# Patient Record
Sex: Female | Born: 1985 | Race: Black or African American | Hispanic: No | Marital: Single | Smoking: Never smoker
Health system: Southern US, Community
[De-identification: ages and names within clinical notes are randomized; demographics above are authoritative.]

## PROBLEM LIST (undated history)

## (undated) DIAGNOSIS — J45909 Unspecified asthma, uncomplicated: Secondary | ICD-10-CM

## (undated) DIAGNOSIS — O139 Gestational [pregnancy-induced] hypertension without significant proteinuria, unspecified trimester: Secondary | ICD-10-CM

## (undated) DIAGNOSIS — A6000 Herpesviral infection of urogenital system, unspecified: Secondary | ICD-10-CM

## (undated) HISTORY — PX: APPENDECTOMY: SHX54

---

## 2012-10-02 ENCOUNTER — Emergency Department (HOSPITAL_BASED_OUTPATIENT_CLINIC_OR_DEPARTMENT_OTHER): Payer: Medicaid Other

## 2012-10-02 ENCOUNTER — Emergency Department (HOSPITAL_BASED_OUTPATIENT_CLINIC_OR_DEPARTMENT_OTHER)
Admission: EM | Admit: 2012-10-02 | Discharge: 2012-10-02 | Disposition: A | Discharge status: Home / Self Care | Payer: Medicaid Other | Attending: Emergency Medicine | Admitting: Emergency Medicine

## 2012-10-02 ENCOUNTER — Encounter (HOSPITAL_BASED_OUTPATIENT_CLINIC_OR_DEPARTMENT_OTHER): Payer: Self-pay | Admitting: *Deleted

## 2012-10-02 DIAGNOSIS — O034 Incomplete spontaneous abortion without complication: Secondary | ICD-10-CM | POA: Insufficient documentation

## 2012-10-02 DIAGNOSIS — Z3201 Encounter for pregnancy test, result positive: Secondary | ICD-10-CM | POA: Insufficient documentation

## 2012-10-02 DIAGNOSIS — IMO0002 Reserved for concepts with insufficient information to code with codable children: Secondary | ICD-10-CM | POA: Insufficient documentation

## 2012-10-02 DIAGNOSIS — O219 Vomiting of pregnancy, unspecified: Secondary | ICD-10-CM | POA: Insufficient documentation

## 2012-10-02 DIAGNOSIS — J45909 Unspecified asthma, uncomplicated: Secondary | ICD-10-CM | POA: Insufficient documentation

## 2012-10-02 DIAGNOSIS — O039 Complete or unspecified spontaneous abortion without complication: Secondary | ICD-10-CM

## 2012-10-02 DIAGNOSIS — Z8619 Personal history of other infectious and parasitic diseases: Secondary | ICD-10-CM | POA: Insufficient documentation

## 2012-10-02 HISTORY — DX: Herpesviral infection of urogenital system, unspecified: A60.00

## 2012-10-02 HISTORY — DX: Unspecified asthma, uncomplicated: J45.909

## 2012-10-02 HISTORY — DX: Gestational (pregnancy-induced) hypertension without significant proteinuria, unspecified trimester: O13.9

## 2012-10-02 LAB — ABO/RH: ABO/RH(D): A POS

## 2012-10-02 LAB — URINE MICROSCOPIC-ADD ON

## 2012-10-02 LAB — URINALYSIS, ROUTINE W REFLEX MICROSCOPIC
Glucose, UA: NEGATIVE mg/dL
Ketones, ur: NEGATIVE mg/dL
Protein, ur: NEGATIVE mg/dL
pH: 6 (ref 5.0–8.0)

## 2012-10-02 LAB — PREGNANCY, URINE: Preg Test, Ur: POSITIVE — AB

## 2012-10-02 NOTE — ED Provider Notes (Addendum)
History     CSN: [id number]  Arrival date & time 10/02/12  8413   First MD Initiated Contact with Patient 10/02/12 0902      Chief Complaint  Patient presents with  . Vaginal Bleeding    (Consider location/radiation/quality/duration/timing/severity/associated sxs/prior treatment) HPI Comments: Patient presents with vaginal bleeding that started this morning. She states that she was diagnosed as being pregnant [redacted] weeks ago at [facility]. She states that she was lifting some heavy boxes this morning and noticed some spotting. She now has blood only when she wipes. She denies any abdominal pain or cramping. She's had some nausea with the pregnancy but denies any vomiting. Denies he fevers or chills. Denies any urinary symptoms. She states that the bleeding has improved since it first started.   Past Medical History  Diagnosis Date  . Asthma   . Gestational HTN   . Herpes genitalia     Past Surgical History  Procedure Date  . Appendectomy   . Cesarean section     No family history on file.  History  Substance Use Topics  . Smoking status: Never Smoker   . Smokeless tobacco: Not on file  . Alcohol Use: No    OB History    Grav Para Term Preterm Abortions TAB SAB Ect Mult Living   1               Review of Systems  Constitutional: Negative for fever, chills, diaphoresis and fatigue.  HENT: Negative for congestion, rhinorrhea and sneezing.   Eyes: Negative.   Respiratory: Negative for cough, chest tightness and shortness of breath.   Cardiovascular: Negative for chest pain and leg swelling.  Gastrointestinal: Positive for nausea. Negative for vomiting, abdominal pain, diarrhea and blood in stool.  Genitourinary: Positive for vaginal bleeding. Negative for frequency, hematuria, flank pain and difficulty urinating.  Musculoskeletal: Negative for back pain and arthralgias.  Skin: Negative for rash.  Neurological: Negative for dizziness, speech difficulty,  weakness, numbness and headaches.    Allergies  Review of patient's allergies indicates no known allergies.  Home Medications   Current Outpatient Rx  Name  Route  Sig  Dispense  Refill  . ONDANSETRON 4 MG PO TBDP   Oral   Take 4 mg by mouth every 8 (eight) hours as needed.         Marland Kitchen VALACYCLOVIR HCL 500 MG PO TABS   Oral   Take 500 mg by mouth daily.           BP 120/72  Pulse 91  Temp 98.9 F (37.2 C) (Oral)  Resp 18  Ht 5\' 2"  (1.575 m)  Wt 118 lb (53.524 kg)  BMI 21.58 kg/m2  SpO2 100%  LMP 08/06/2012  Physical Exam  Constitutional: She is oriented to person, place, and time. She appears well-developed and well-nourished.  HENT:  Head: Normocephalic and atraumatic.  Eyes: Pupils are equal, round, and reactive to light.  Neck: Normal range of motion. Neck supple.  Cardiovascular: Normal rate, regular rhythm and normal heart sounds.   Pulmonary/Chest: Effort normal and breath sounds normal. No respiratory distress. She has no wheezes. She has no rales. She exhibits no tenderness.  Abdominal: Soft. Bowel sounds are normal. There is no tenderness. There is no rebound and no guarding.  Genitourinary:       There is a scant amount of dark red blood in the vault. There is no active bleeding. The cervix is closed. There some mild uterine tenderness  but no adnexal tenderness.  Musculoskeletal: Normal range of motion. She exhibits no edema.  Lymphadenopathy:    She has no cervical adenopathy.  Neurological: She is alert and oriented to person, place, and time.  Skin: Skin is warm and dry. No rash noted.  Psychiatric: She has a normal mood and affect.    ED Course  Procedures (including critical care time)  Results for orders placed during the hospital encounter of 10/02/12  URINALYSIS, ROUTINE W REFLEX MICROSCOPIC      Component Value Range   Color, Urine YELLOW  YELLOW   APPearance CLOUDY (*) CLEAR   Specific Gravity, Urine 1.013  1.005 - 1.030   pH 6.0  5.0 -  8.0   Glucose, UA NEGATIVE  NEGATIVE mg/dL   Hgb urine dipstick MODERATE (*) NEGATIVE   Bilirubin Urine NEGATIVE  NEGATIVE   Ketones, ur NEGATIVE  NEGATIVE mg/dL   Protein, ur NEGATIVE  NEGATIVE mg/dL   Urobilinogen, UA 1.0  0.0 - 1.0 mg/dL   Nitrite POSITIVE (*) NEGATIVE   Leukocytes, UA TRACE (*) NEGATIVE  PREGNANCY, URINE      Component Value Range   Preg Test, Ur POSITIVE (*) NEGATIVE  HCG, QUANTITATIVE, PREGNANCY      Component Value Range   hCG, Beta Chain, Quant, S 6279 (*) <5 mIU/mL  ABO/RH      Component Value Range   ABO/RH(D) A POS     No rh immune globuloin NOT A RH IMMUNE GLOBULIN CANDIDATE, PT RH POSITIVE    URINE MICROSCOPIC-ADD ON      Component Value Range   Squamous Epithelial / LPF MANY (*) RARE   WBC, UA 3-6  <3 WBC/hpf   RBC / HPF 0-2  <3 RBC/hpf   Bacteria, UA MANY (*) RARE   US Ob Comp Less 14 Wks  10/02/2012  *RADIOLOGY REPORT*  Clinical Data: Vaginal bleeding.  8-week-1-day gestational age by LMP.  OBSTETRIC <14 WK [place] AND TRANSVAGINAL OB US  Technique:  Both transabdominal and transvaginal ultrasound examinations were performed for complete evaluation of the gestation as well as the maternal uterus, adnexal regions, and pelvic cul-de-sac.  Transvaginal technique was performed to assess early pregnancy.  Comparison:  None.  Intrauterine gestational sac:  Visualized/normal in shape. Yolk sac: Visualized Embryo: Visualized Cardiac Activity: Absent  CRL: 19  mm  8 w  3 d         [place] EDC: 05/11/2013  Maternal uterus/adnexae: Small subchorionic hemorrhage noted.  Both ovaries are normal in appearance.  No evidence of adnexal mass or free fluid.  IMPRESSION: Findings meet definitive criteria for failed pregnancy.  This recommendation follows SRU consensus guidelines: Diagnostic Criteria for Nonviable Pregnancy Early in the First Trimester.  Malva Limes Med 2013; 161:0960-45.   Original Report Authenticated By: Myles Rosenthal, M.D.    US Ob Transvaginal  10/02/2012   *RADIOLOGY REPORT*  Clinical Data: Vaginal bleeding.  8-week-1-day gestational age by LMP.  OBSTETRIC <14 WK [place] AND TRANSVAGINAL OB US  Technique:  Both transabdominal and transvaginal ultrasound examinations were performed for complete evaluation of the gestation as well as the maternal uterus, adnexal regions, and pelvic cul-de-sac.  Transvaginal technique was performed to assess early pregnancy.  Comparison:  None.  Intrauterine gestational sac:  Visualized/normal in shape. Yolk sac: Visualized Embryo: Visualized Cardiac Activity: Absent  CRL: 19  mm  8 w  3 d         [place] EDC: 05/11/2013  Maternal uterus/adnexae: Small subchorionic hemorrhage  noted.  Both ovaries are normal in appearance.  No evidence of adnexal mass or free fluid.  IMPRESSION: Findings meet definitive criteria for failed pregnancy.  This recommendation follows SRU consensus guidelines: Diagnostic Criteria for Nonviable Pregnancy Early in the First Trimester.  Malva Limes Med 2013; 161:0960-45.   Original Report Authenticated By: Myles Rosenthal, M.D.        1. Spontaneous abortion       MDM  Patient was instructed to followup with her OB/GYN his [place] within the next few days. She was advised to call her OB or return here if she has heavy bleeding or worsening pain in her abdomen. Also by surgery return if she feels dizzy or lightheaded or feels like she's going to passout.  Her urine was sent for a culture and she is currently not having any UTI symptoms so I did not start treatment.        Rolan Bucco, MD 10/02/12 1128  Rolan Bucco, MD 10/02/12 4098  Rolan Bucco, MD 10/02/12 1155

## 2012-10-02 NOTE — ED Notes (Signed)
Patient states she developed vaginal bleeding this morning after lifting a heavy box.  States she had her last depo injection in Aug 2013, and had a heavy menstrual cycle on 08/06/12.  Was having severe nausea, was seen at [facility] and had a positive urine pregnancy test.  Denies any pain. States she saw blood with urination, which began as light and then became heavy.

## 2012-10-02 NOTE — ED Notes (Signed)
Pelvic cart is set up at the bedside and ready for the doctor to use. 

## 2012-10-03 LAB — URINE CULTURE: Colony Count: 35000

## 2013-05-14 IMAGING — US US OB TRANSVAGINAL
1 series · 14 of 28 positions shown · non-contrast
Comparison: None.

CLINICAL DATA: Vaginal bleeding.  8-week-4-day gestational age by
LMP.

OBSTETRIC <14 WK US AND TRANSVAGINAL OB US
TECHNIQUE: Both transabdominal and transvaginal ultrasound
examinations were performed for complete evaluation of the
gestation as well as the maternal uterus, adnexal regions, and
pelvic cul-de-sac.  Transvaginal technique was performed to assess
early pregnancy.

[Series 1: us ob transvaginal · 0.24mm/px · 39 acquisitions, 14 frames shown]
[im 2/39]
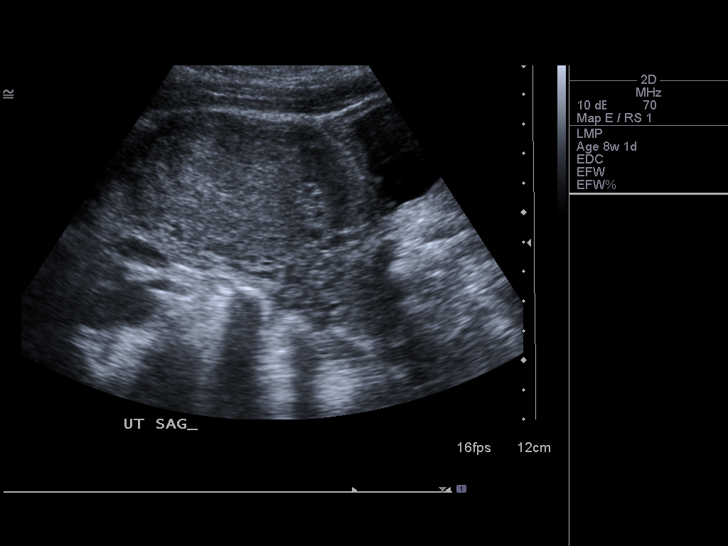
[im 5/39]
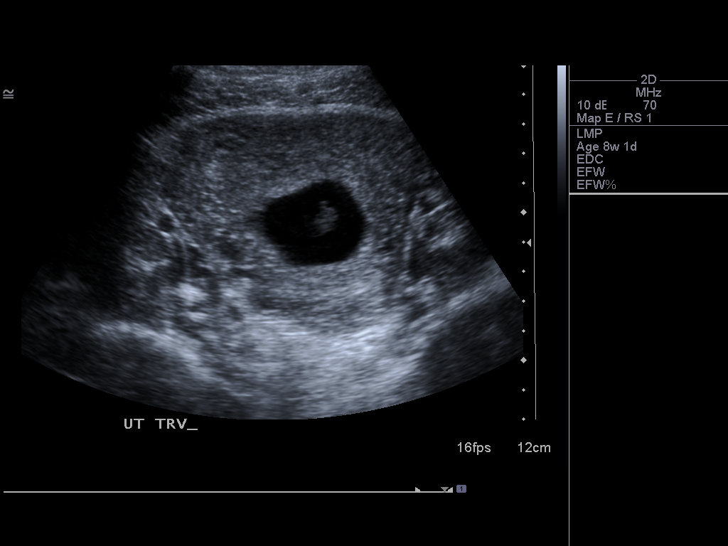
[im 8/39]
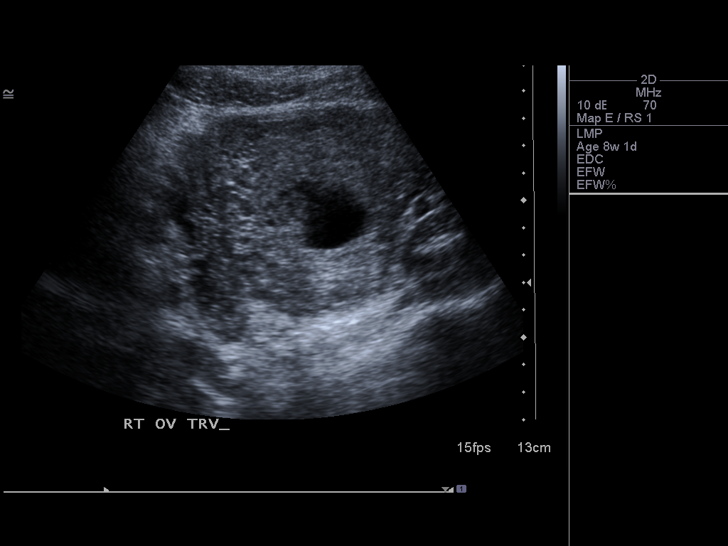
[im 10/39]
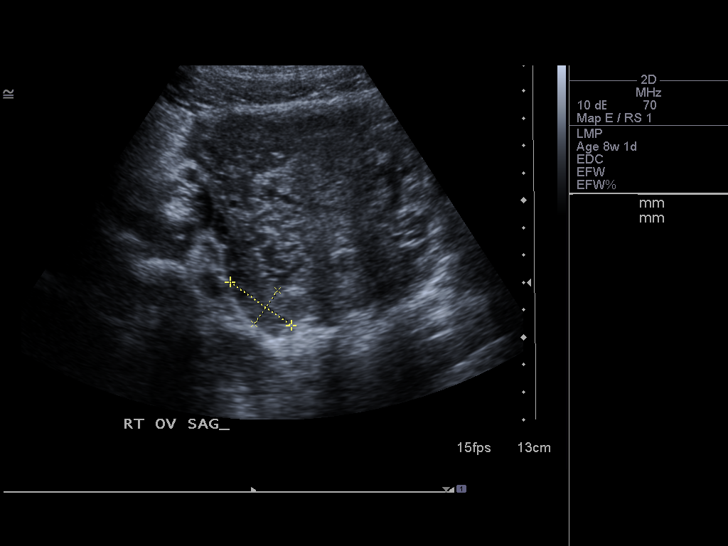
[im 13/39]
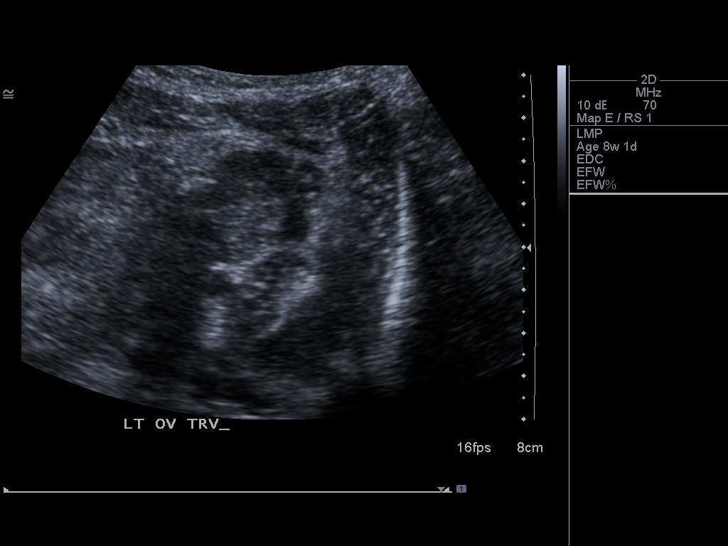
[im 16/39]
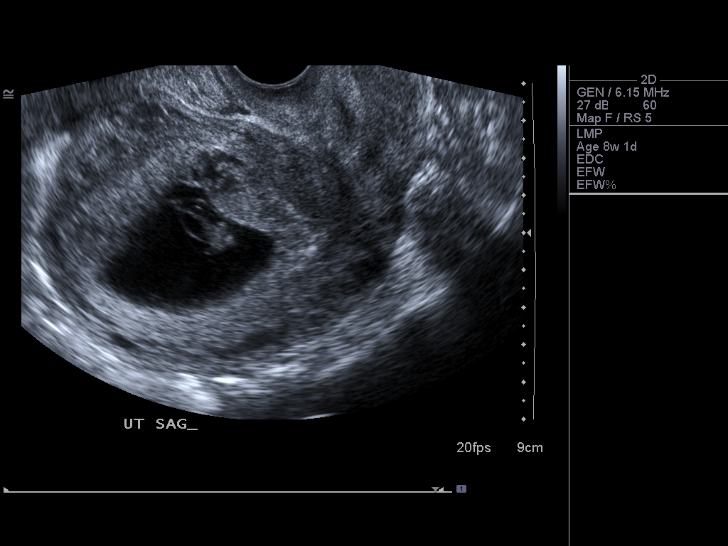
[im 19/39]
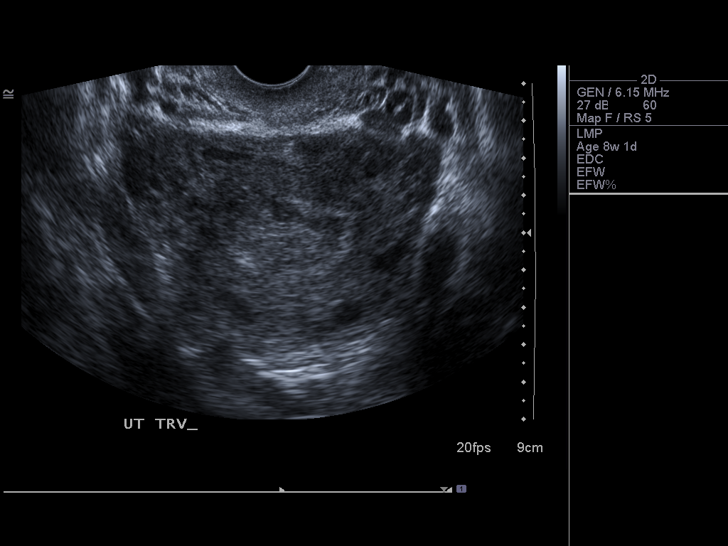
[im 22/39]
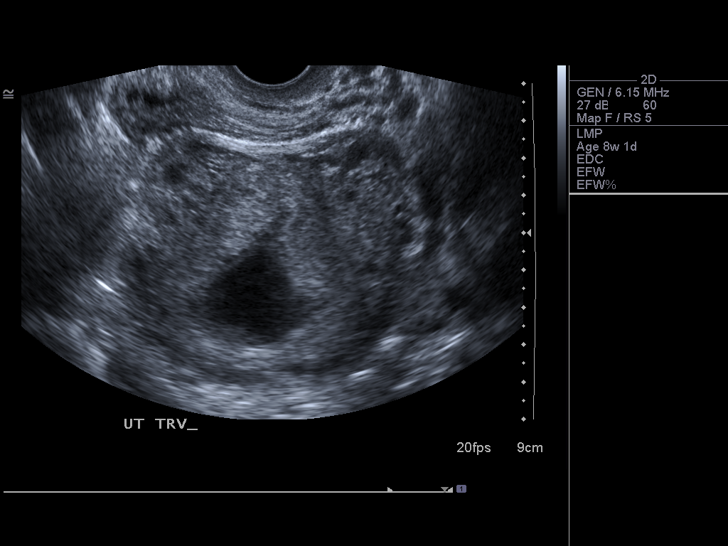
[im 24/39]
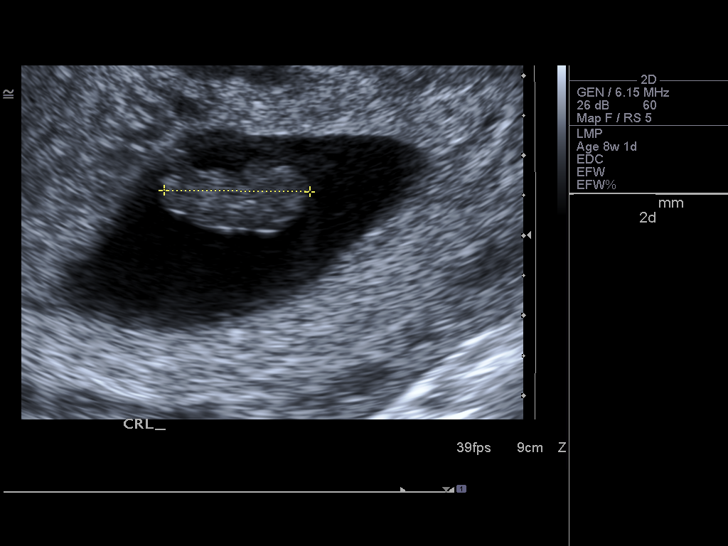
[im 27/39]
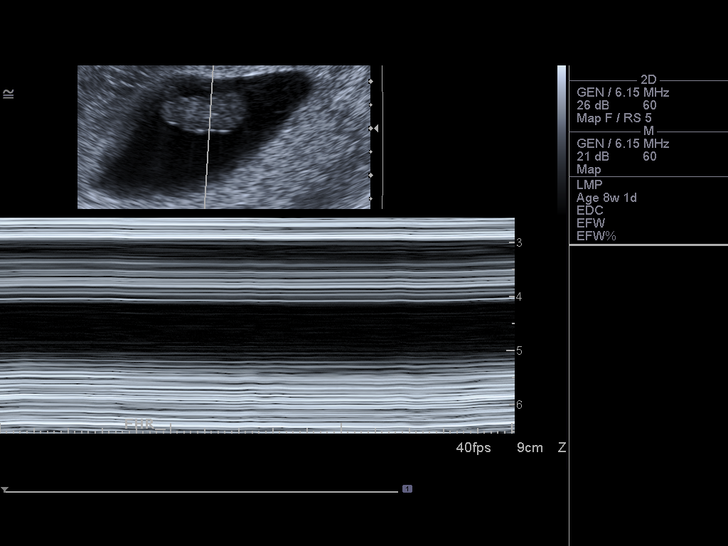
[im 30/39]
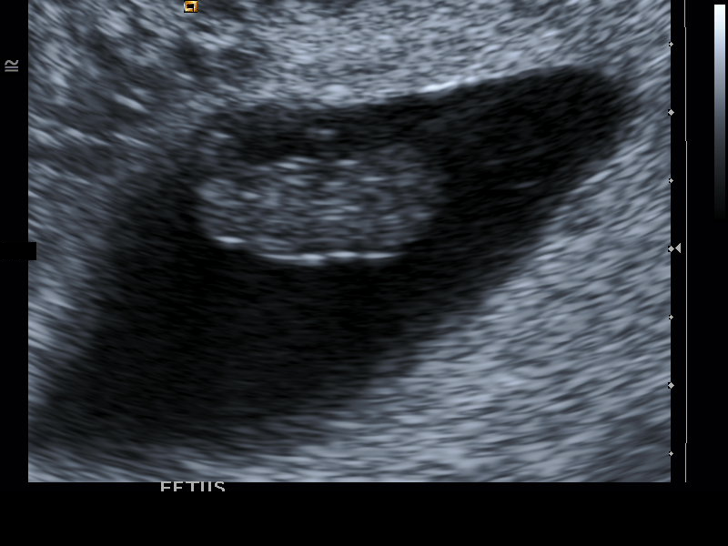
[im 33/39]
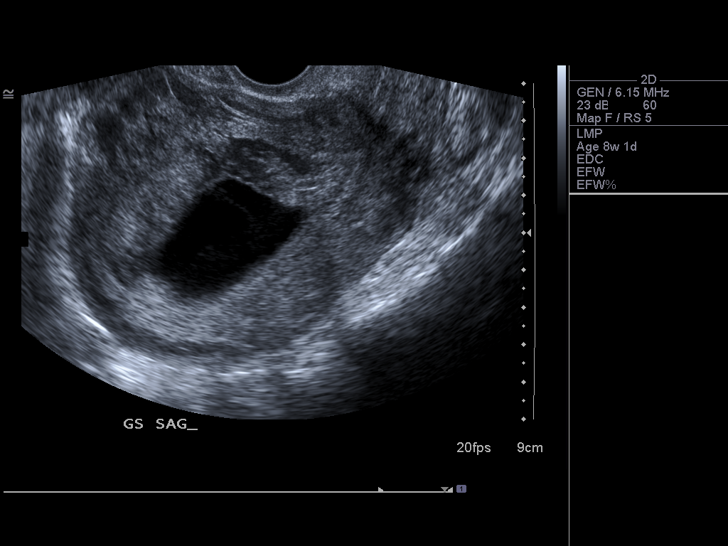
[im 36/39]
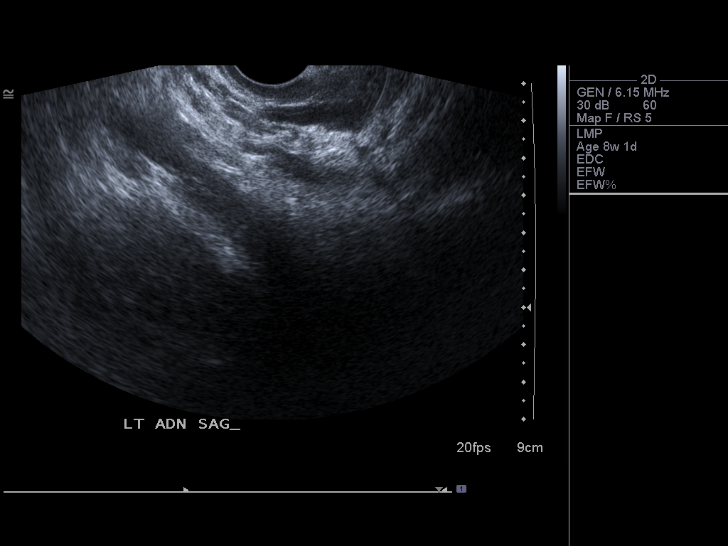
[im 39/39]
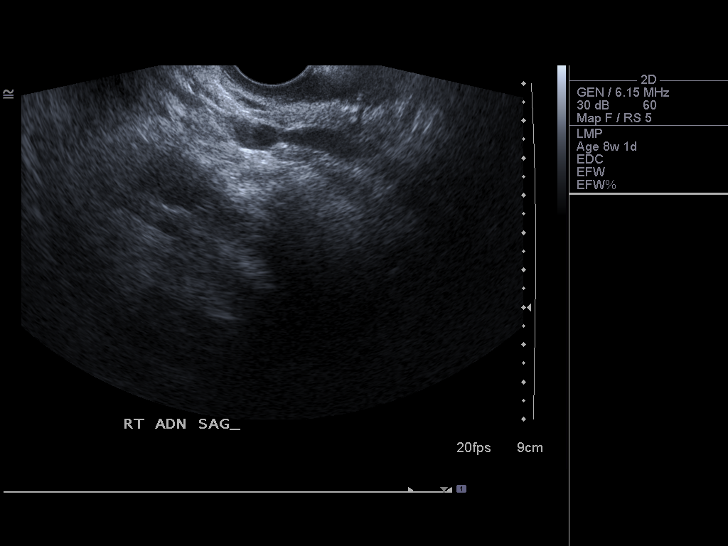

[14 of 28 positions shown; findings below may reference images not displayed]

Intrauterine gestational sac:  Visualized/normal in shape.
Yolk sac: Visualized
Embryo: Visualized
Cardiac Activity: Absent

CRL: 19  mm  8 w  3 d         US EDC: 05/11/2013

Maternal uterus/adnexae:
Small subchorionic hemorrhage noted.  Both ovaries are normal in
appearance.  No evidence of adnexal mass or free fluid.
IMPRESSION: Findings meet definitive criteria for failed pregnancy.  This
recommendation follows SRU consensus guidelines: Diagnostic
Criteria for Nonviable Pregnancy Early in the First Trimester.  N
Engl J Med 0457; [DATE].

## 2014-08-24 ENCOUNTER — Encounter (HOSPITAL_BASED_OUTPATIENT_CLINIC_OR_DEPARTMENT_OTHER): Payer: Self-pay | Admitting: *Deleted

## 2019-04-03 ENCOUNTER — Encounter (HOSPITAL_COMMUNITY): Payer: Self-pay

## 2019-04-03 ENCOUNTER — Emergency Department (HOSPITAL_COMMUNITY)
Admission: EM | Admit: 2019-04-03 | Discharge: 2019-04-03 | Disposition: A | Discharge status: Home / Self Care | Payer: Self-pay | Attending: Emergency Medicine | Admitting: Emergency Medicine

## 2019-04-03 ENCOUNTER — Other Ambulatory Visit: Payer: Self-pay

## 2019-04-03 DIAGNOSIS — J45909 Unspecified asthma, uncomplicated: Secondary | ICD-10-CM | POA: Insufficient documentation

## 2019-04-03 DIAGNOSIS — N390 Urinary tract infection, site not specified: Secondary | ICD-10-CM | POA: Insufficient documentation

## 2019-04-03 DIAGNOSIS — R319 Hematuria, unspecified: Secondary | ICD-10-CM | POA: Insufficient documentation

## 2019-04-03 DIAGNOSIS — E876 Hypokalemia: Secondary | ICD-10-CM

## 2019-04-03 LAB — COMPREHENSIVE METABOLIC PANEL
ALT: 13 U/L (ref 0–44)
AST: 15 U/L (ref 15–41)
Albumin: 4.8 g/dL (ref 3.5–5.0)
Alkaline Phosphatase: 72 U/L (ref 38–126)
Anion gap: 12 (ref 5–15)
BUN: 6 mg/dL (ref 6–20)
CO2: 28 mmol/L (ref 22–32)
Calcium: 9.1 mg/dL (ref 8.9–10.3)
Chloride: 89 mmol/L — ABNORMAL LOW (ref 98–111)
Creatinine, Ser: 0.81 mg/dL (ref 0.44–1.00)
GFR calc Af Amer: >60 mL/min (ref >60)
GFR calc non Af Amer: >60 mL/min (ref >60)
Glucose, Bld: 97 mg/dL (ref 70–99)
Potassium: 2.7 mmol/L — CL (ref 3.5–5.1)
Sodium: 129 mmol/L — ABNORMAL LOW (ref 135–145)
Total Bilirubin: 0.8 mg/dL (ref 0.3–1.2)
Total Protein: 9 g/dL — ABNORMAL HIGH (ref 6.5–8.1)

## 2019-04-03 LAB — CBC WITH DIFFERENTIAL/PLATELET
Abs Immature Granulocytes: 0.04 10*3/uL (ref 0.00–0.07)
Basophils Absolute: 0 10*3/uL (ref 0.0–0.1)
Basophils Relative: 0 %
Eosinophils Absolute: 0 10*3/uL (ref 0.0–0.5)
Eosinophils Relative: 0 %
HCT: 41.5 % (ref 36.0–46.0)
Hemoglobin: 13.4 g/dL (ref 12.0–15.0)
Immature Granulocytes: 0 %
Lymphocytes Relative: 20 %
Lymphs Abs: 2.4 10*3/uL (ref 0.7–4.0)
MCH: 25.4 pg — ABNORMAL LOW (ref 26.0–34.0)
MCHC: 32.3 g/dL (ref 30.0–36.0)
MCV: 78.7 fL — ABNORMAL LOW (ref 80.0–100.0)
Monocytes Absolute: 0.9 10*3/uL (ref 0.1–1.0)
Monocytes Relative: 7 %
Neutro Abs: 8.4 10*3/uL — ABNORMAL HIGH (ref 1.7–7.7)
Neutrophils Relative %: 73 %
Platelets: 535 10*3/uL — ABNORMAL HIGH (ref 150–400)
RBC: 5.27 MIL/uL — ABNORMAL HIGH (ref 3.87–5.11)
RDW: 17.6 % — ABNORMAL HIGH (ref 11.5–15.5)
WBC: 11.7 10*3/uL — ABNORMAL HIGH (ref 4.0–10.5)
nRBC: 0 % (ref 0.0–0.2)

## 2019-04-03 LAB — URINALYSIS, ROUTINE W REFLEX MICROSCOPIC
Glucose, UA: NEGATIVE mg/dL
Hgb urine dipstick: NEGATIVE
Ketones, ur: 5 mg/dL — AB
Nitrite: POSITIVE — AB
Protein, ur: 30 mg/dL — AB
Specific Gravity, Urine: 1.024 (ref 1.005–1.030)
WBC, UA: >50 WBC/hpf — ABNORMAL HIGH (ref 0–5)
pH: 5 (ref 5.0–8.0)

## 2019-04-03 LAB — PREGNANCY, URINE: Preg Test, Ur: NEGATIVE

## 2019-04-03 MED ORDER — CEPHALEXIN 500 MG PO CAPS: 500.0000 mg | ORAL_CAPSULE | Freq: Four times a day (QID) | ORAL | 0 refills | Status: AC

## 2019-04-03 MED ORDER — ONDANSETRON HCL 4 MG/2ML IJ SOLN
4.0000 mg | Freq: Once | INTRAMUSCULAR | Status: DC
  Filled 2019-04-03: qty 2

## 2019-04-03 MED ORDER — POTASSIUM CHLORIDE CRYS ER 20 MEQ PO TBCR
60.0000 meq | EXTENDED_RELEASE_TABLET | Freq: Once | ORAL | Status: AC
  Administered 2019-04-03: 60 meq via ORAL
  Filled 2019-04-03: qty 3

## 2019-04-03 MED ORDER — METOCLOPRAMIDE HCL 5 MG/ML IJ SOLN
10.0000 mg | Freq: Once | INTRAMUSCULAR | Status: AC
  Administered 2019-04-03: 10 mg via INTRAVENOUS
  Filled 2019-04-03: qty 2

## 2019-04-03 MED ORDER — POTASSIUM CHLORIDE 10 MEQ/100ML IV SOLN
10.0000 meq | INTRAVENOUS | Status: AC
Start: 2019-04-03 — End: 2019-04-03
  Administered 2019-04-03 (×2): 10 meq via INTRAVENOUS
  Filled 2019-04-03 (×2): qty 100

## 2019-04-03 MED ORDER — POTASSIUM CHLORIDE 10 MEQ/100ML IV SOLN
10.0000 meq | Freq: Once | INTRAVENOUS | Status: DC
Start: 2019-04-03 — End: 2019-04-03

## 2019-04-03 MED ORDER — SODIUM CHLORIDE 0.9 % IV BOLUS
1000.0000 mL | Freq: Once | INTRAVENOUS | Status: AC
  Administered 2019-04-03: 13:00:00 1000 mL via INTRAVENOUS

## 2019-04-03 MED ORDER — SODIUM CHLORIDE 0.9 % IV SOLN
1.0000 g | Freq: Once | INTRAVENOUS | Status: AC
  Administered 2019-04-03: 14:00:00 1 g via INTRAVENOUS
  Filled 2019-04-03: qty 10

## 2019-04-03 MED ORDER — POTASSIUM CHLORIDE ER 10 MEQ PO TBCR: 20.0000 meq | EXTENDED_RELEASE_TABLET | Freq: Every day | ORAL | 0 refills | Status: AC

## 2019-04-03 NOTE — ED Triage Notes (Addendum)
Patient dropped off.    Patient C/O sharp pain in right lower abdomen 2 days ago.  Patient C/O nausea 10 occurrences of emesis Patient is now able to tolerate fluid per patient  Patient denies abdominal pain at this time.  Patient states urine has odor and cloudy "again"  Patient is from [facility] and went to ED for UTI and was given antibiotic. Patient states she lost her antibiotic and only was able to take it for 3 days.   Patient stats she thinks her UTI is back and she "threw up all night and this morning".    A/OX4 Ambulatory in triage.

## 2019-04-03 NOTE — Discharge Instructions (Addendum)
You have been diagnosed today with urinary tract infection and hypokalemia.  At this time there does not appear to be the presence of an emergent medical condition, however there is always the potential for conditions to change. Please read and follow the below instructions.  Please return to the Emergency Department immediately for any new or worsening symptoms or if your symptoms do not improve within 3 days. Please be sure to follow up with your Primary Care Provider within one week regarding your visit today; please call their office to schedule an appointment even if you are feeling better for a follow-up visit. Please take the antibiotic Keflex 500 mg 4 times daily for the next 7 days to treat your urinary tract infection. Please take the potassium medication as prescribed to help with your low potassium levels.  Follow-up with your primary care provider within the next 5 days for repeat blood work to ensure improvement of potassium level.  Get help right away if: You have very bad back pain. You have very bad pain in your lower belly. You have a fever. You are sick to your stomach (nauseous). You are throwing up. You have chest pain. You have shortness of breath. You have vomiting or diarrhea that lasts for more than 2 days. You faint. Any new/concerning or worsening symptoms  Please read the additional information packets attached to your discharge summary.  Do not take your medicine if  develop an itchy rash, swelling in your mouth or lips, or difficulty breathing; call 911 and seek immediate emergency medical attention if this occurs.

## 2019-04-03 NOTE — ED Notes (Signed)
Patient is aware that urine sample is needed and will call out when she is ready to use the restroom

## 2019-04-03 NOTE — ED Provider Notes (Signed)
Care [facility]off [facility] PA-C at shift change please see his note for full details.  In short patient recently diagnosed with UTI, only took 3 days worth of medications and returns for dysuria, nausea and vomiting.  CBC with leukocytosis of 11.7 CMP with sodium 129, potassium 2.7 Urine pregnancy test negative Urinalysis shows many bacteria, greater than 50 white blood cells, positive nitrite, moderate leukocyte  Patient has received 1 g IV Rocephin, Reglan, 2 runs IV potassium and 60 p.o. potassium.  Plan of care at shift change was to await completion of IV potassium and discharge with Keflex and PCP follow-up. Physical Exam  BP (!) 147/116 (BP Location: Right Arm)   Pulse 100   Temp 99.1 F (37.3 C) (Oral)   Resp 11   LMP 03/20/2019   SpO2 100%   Breastfeeding Unknown   Physical Exam Constitutional:      General: She is not in acute distress.    Appearance: Normal appearance. She is well-developed. She is not ill-appearing or diaphoretic.  HENT:     Head: Normocephalic and atraumatic.     Right Ear: External ear normal.     Left Ear: External ear normal.     Nose: Nose normal.  Eyes:     General: Vision grossly intact. Gaze aligned appropriately.     Pupils: Pupils are equal, round, and reactive to light.  Neck:     Musculoskeletal: Normal range of motion.     Trachea: Trachea and phonation normal. No tracheal deviation.  Pulmonary:     Effort: Pulmonary effort is normal. No respiratory distress.  Abdominal:     General: There is no distension.     Palpations: Abdomen is soft.     Tenderness: There is no abdominal tenderness. There is no guarding or rebound.  Musculoskeletal: Normal range of motion.  Skin:    General: Skin is warm and dry.  Neurological:     Mental Status: She is alert.     GCS: GCS eye subscore is 4. GCS verbal subscore is 5. GCS motor subscore is 6.     Comments: Speech is clear and goal oriented, follows commands Major Cranial  nerves without deficit, no facial droop Moves extremities without ataxia, coordination intact  Psychiatric:        Behavior: Behavior normal.     ED Course/Procedures   Clinical Course as of Apr 02 1725  Thu Apr 03, 2019  1528 UTI; needs potassium; d/c with keflex   [BM]    Clinical Course User Index [BM] Bill SalinasMorelli, Natallie Ravenscroft A, PA-C    Procedures  MDM  Patient has completed her IV potassium, she is also been given her 60 mEq p.o. here today.  She states that she is feeling much better after medications and is requesting discharge as her friend is coming to pick her up.  No abdominal tenderness on my examination and she is tolerating p.o., well-appearing and in no acute distress vital signs stable in room without tachycardia or hypoxia on room air.  Keflex 500 mg 4 times daily x7 days prescribed.  I advised that patient complete her full course of antibiotics and follow-up with PCP this week for recheck.  I have also given her 4 days worth of 20 mEq potassium for hypokalemia, suspect this will begin to self resolve with cessation of her nausea and vomiting and return to normal diet, advised to follow-up with PCP for blood work within 1 week.  No CVA tenderness on examination, afebrile  reassuring vital signs do not suspect pyelonephritis at this time.  Patient seems appropriate for outpatient treatment of her UTI.  At this time there does not appear to be any evidence of an acute emergency medical condition and the patient appears stable for discharge with appropriate outpatient follow up. Diagnosis was discussed with patient who verbalizes understanding of care plan and is agreeable to discharge. I have discussed return precautions with patient who verbalizes understanding of return precautions. Patient encouraged to follow-up with their PCP. All questions answered.  Patient has been discharged in good condition.   Note: Portions of this report may have been transcribed using voice recognition  software. Every effort was made to ensure accuracy; however, inadvertent computerized transcription errors may still be present.   Gari Crown 04/03/19 1738    Hayden Rasmussen, MD 04/04/19 [phone]

## 2019-04-03 NOTE — ED Notes (Signed)
Date and time results received: 04/03/19 2:45 PM  Test: Potassium Critical Value: 2.7

## 2019-04-03 NOTE — ED Provider Notes (Addendum)
[facility]-EMERGENCY DEPT Provider Note   CSN: [id number] Arrival date & time: 04/03/19  1046     History   Chief Complaint Chief Complaint  Patient presents with  . Urinary Tract Infection  . Nausea    HPI Rachel Harper is a 33 y.o. female.     HPI Patient presents to the emergency department with complaints of dysuria and nausea and vomiting.  The patient states [facility] with a urinary tract infection.  The patient states that she only took 3 days worth of the medication.  Patient states she has been trying to increase her fluid intake.  The patient denies chest pain, shortness of breath, headache,blurred vision, neck pain, fever, cough, weakness, numbness, dizziness, anorexia, edema, abdominal pain, nausea, vomiting, diarrhea, rash, back pain, dysuria, hematemesis, bloody stool, near syncope, or syncope. Past Medical History:  Diagnosis Date  . Asthma   . Gestational HTN   . Herpes genitalia     There are no active problems to display for this patient.   Past Surgical History:  Procedure Laterality Date  . APPENDECTOMY    . CESAREAN SECTION       OB History    Gravida  1   Para      Term      Preterm      AB      Living        SAB      TAB      Ectopic      Multiple      Live Births               Home Medications    Prior to Admission medications   Not on File    Family History History reviewed. No pertinent family history.  Social History Social History   Tobacco Use  . Smoking status: Never Smoker  . Smokeless tobacco: Never Used  Substance Use Topics  . Alcohol use: No  . Drug use: Yes    Types: Marijuana    Comment: was a heavy marijuana smoker, quit 2 weeks ago.     Allergies   Ondansetron hcl   Review of Systems Review of Systems All other systems negative except as documented in the HPI. All pertinent positives and negatives as reviewed in the HPI.   Physical Exam Updated Vital Signs BP (!) 134/96 (BP Location: Right Arm)   Pulse 72   Temp 99.1 F (37.3 C) (Oral)   Resp 17   LMP 03/20/2019   SpO2 99%   Breastfeeding Unknown   Physical Exam Vitals signs and nursing note reviewed.  Constitutional:      General: She is not in acute distress.    Appearance: She is well-developed.  HENT:     Head: Normocephalic and atraumatic.  Eyes:     Pupils: Pupils are equal, round, and reactive to light.  Neck:     Musculoskeletal: Normal range of motion and neck supple.  Cardiovascular:     Rate and Rhythm: Normal rate and regular rhythm.     Heart sounds: Normal heart sounds. No murmur. No friction rub. No gallop.   Pulmonary:     Effort: Pulmonary effort is normal. No respiratory distress.     Breath sounds: Normal breath sounds. No wheezing.  Abdominal:     General: Bowel sounds are normal. There is no distension.     Palpations: Abdomen is soft. There is no mass.  Tenderness: There is no abdominal tenderness. There is no right CVA tenderness, guarding or rebound.     Hernia: No hernia is present.  Skin:    General: Skin is warm and dry.     Capillary Refill: Capillary refill takes less than 2 seconds.     Findings: No erythema or rash.  Neurological:     Mental Status: She is alert and oriented to person, place, and time.     Motor: No abnormal muscle tone.     Coordination: Coordination normal.  Psychiatric:        Behavior: Behavior normal.      ED Treatments / Results  Labs (all labs ordered are listed, but only abnormal results are displayed) Labs Reviewed  COMPREHENSIVE METABOLIC PANEL - Abnormal; Notable for the following components:      Result Value   Sodium 129 (*)    Potassium 2.7 (*)    Chloride 89 (*)    Total Protein 9.0 (*)    All other components within normal limits  CBC WITH DIFFERENTIAL/PLATELET - Abnormal; Notable for the following components:   WBC 11.7 (*)    RBC 5.27 (*)    MCV 78.7 (*)     MCH 25.4 (*)    RDW 17.6 (*)    Platelets 535 (*)    Neutro Abs 8.4 (*)    All other components within normal limits  URINALYSIS, ROUTINE W REFLEX MICROSCOPIC - Abnormal; Notable for the following components:   Color, Urine [place] (*)    APPearance CLOUDY (*)    Bilirubin Urine SMALL (*)    Ketones, ur 5 (*)    Protein, ur 30 (*)    Nitrite POSITIVE (*)    Leukocytes,Ua MODERATE (*)    WBC, UA >50 (*)    Bacteria, UA MANY (*)    All other components within normal limits  PREGNANCY, URINE    EKG    Radiology No results found.  Procedures Procedures (including critical care time)  Medications Ordered in ED Medications  ondansetron (ZOFRAN) injection 4 mg (has no administration in time range)  potassium chloride SA (K-DUR) CR tablet 60 mEq (has no administration in time range)  potassium chloride 10 mEq in 100 mL IVPB (has no administration in time range)  sodium chloride 0.9 % bolus 1,000 mL (1,000 mLs Intravenous New Bag/Given 04/03/19 1320)  metoCLOPramide (REGLAN) injection 10 mg (10 mg Intravenous Given 04/03/19 1334)  cefTRIAXone (ROCEPHIN) 1 g in sodium chloride 0.9 % 100 mL IVPB (1 g Intravenous New Bag/Given 04/03/19 1405)     Initial Impression / Assessment and Plan / ED Course  I have reviewed the triage vital signs and the nursing notes.  Pertinent labs & imaging results that were available during my care of the patient were reviewed by me and considered in my medical decision making (see chart for details).  Clinical Course as of Apr 03 1441  Thu Apr 03, 2019  1528 UTI; needs potassium; d/c with keflex   [BM]    Clinical Course User Index [BM] Deliah Boston, PA-C       Patient is feeling better following IV fluids and able to tolerate oral fluids.  She will get 2 rounds potassium along with oral potassium.  She is also given IV Rocephin.  Patient will be discharged after this.  Final Clinical Impressions(s) / ED Diagnoses   Final diagnoses:   None    ED Discharge Orders    None  Charlestine NightLawyer, Mileah Hemmer, PA-C 04/03/19 1500    Charlestine NightLawyer, Evangela Heffler, PA-C 04/04/19 1442    Charlynne PanderYao, David Hsienta, MD 04/04/19 [phone]

## 2019-04-06 LAB — URINE CULTURE: Culture: >=100000 — AB

## 2019-04-07 ENCOUNTER — Telehealth: Payer: Self-pay | Admitting: Emergency Medicine

## 2019-04-07 NOTE — Telephone Encounter (Signed)
Post ED Visit - Positive Culture Follow-up: Successful Patient Follow-Up  Culture assessed and recommendations reviewed by:  []  Elenor Quinones, Pharm.D. []  Heide Guile, Pharm.D., BCPS AQ-ID []  Parks Neptune, Pharm.D., BCPS []  Alycia Rossetti, Pharm.D., BCPS []  Luna, Pharm.D., BCPS, AAHIVP []  Legrand Como, Pharm.D., BCPS, AAHIVP []  Salome Arnt, PharmD, BCPS []  Johnnette Gourd, PharmD, BCPS []  Hughes Better, PharmD, BCPS []  Leeroy Cha, PharmD Theodis Shove PharmD  Positive urine culture  []  Patient discharged without antimicrobial prescription and treatment is now indicated [x]  Organism is resistant to prescribed ED discharge antimicrobial []  Patient with positive blood cultures  Changes discussed with ED provider: Gust Brooms PA New antibiotic prescription stop Cephalexin, start nitrofurantoin 100mg  po bid x 7 days  Attempting to contact patient   Hazle Nordmann 04/07/2019, 1:49 PM

## 2019-04-07 NOTE — Progress Notes (Cosign Needed)
ED Antimicrobial Stewardship Positive Culture Follow Up   Rachel Harper is an 33 y.o. female who presented to [facility] on 04/03/2019 with a chief complaint of  Chief Complaint  Patient presents with  . Urinary Tract Infection  . Nausea   Allergies  Allergen Reactions  . Ondansetron Hcl Other (See Comments)    Zofran IV (prolong QT)     Recent Results (from the past 720 hour(s))  Urine culture     Status: Abnormal   Collection Time: 04/03/19 12:22 PM   Specimen: Urine, Random  Result Value Ref Range Status   Specimen Description   Final    URINE, RANDOM Performed at [facility] [address] [place], [place] [address]    Special Requests   Final    NONE Performed at [facility], [place] [address], [place], [place] [address]    Culture (A)  Final    >=100,000 COLONIES/mL ESCHERICHIA COLI 80,000 COLONIES/mL ENTEROCOCCUS FAECALIS    Report Status 04/06/2019 FINAL  Final   Organism ID, Bacteria ESCHERICHIA COLI (A)  Final   Organism ID, Bacteria ENTEROCOCCUS FAECALIS (A)  Final      Susceptibility   Escherichia coli - MIC*    AMPICILLIN >=32 RESISTANT Resistant     CEFAZOLIN 8 SENSITIVE Sensitive     CEFTRIAXONE <=1 SENSITIVE Sensitive     CIPROFLOXACIN >=4 RESISTANT Resistant     GENTAMICIN <=1 SENSITIVE Sensitive     IMIPENEM <=0.25 SENSITIVE Sensitive     NITROFURANTOIN <=16 SENSITIVE Sensitive     TRIMETH/SULFA <=20 SENSITIVE Sensitive     AMPICILLIN/SULBACTAM >=32 RESISTANT Resistant     PIP/TAZO <=4 SENSITIVE Sensitive     Extended ESBL NEGATIVE Sensitive     * >=100,000 COLONIES/mL ESCHERICHIA COLI   Enterococcus faecalis - MIC*    AMPICILLIN <=2 SENSITIVE Sensitive     LEVOFLOXACIN 2 SENSITIVE Sensitive     NITROFURANTOIN <=16 SENSITIVE Sensitive     VANCOMYCIN 2 SENSITIVE Sensitive     * 80,000 COLONIES/mL ENTEROCOCCUS FAECALIS    [x]  Treated with cephalexin, organism resistant to prescribed antimicrobial  Pt  admitted with worsening UTI symptoms after taking several days of antibiotics outpatient. Pt was discharged on cephalexin. Urine culture growing two organisms: E.coli (S to cefazolin) and enterococcus faecalis, which will not be covered by cephalosporin. Pregnancy test negative.  Per notes and review with PA, do not suspect pyelonephritis.   Plan: Stop taking cephalexin, start taking nitrofurantoin.  New antibiotic prescription: nitrofurantoin 100 mg PO BID x 7 days. No refill.  ED Provider: Janeece Fitting, [place], PharmD 04/07/2019, 12:42 PM Clinical Pharmacist [phone]

## 2024-09-25 ENCOUNTER — Encounter: Payer: Self-pay | Admitting: Emergency Medicine

## 2024-09-25 ENCOUNTER — Ambulatory Visit
Admission: EM | Admit: 2024-09-25 | Discharge: 2024-09-25 | Disposition: A | Discharge status: Home / Self Care | Attending: Internal Medicine | Admitting: Internal Medicine

## 2024-09-25 DIAGNOSIS — R1116 Cannabis hyperemesis syndrome: Secondary | ICD-10-CM

## 2024-09-25 DIAGNOSIS — R112 Nausea with vomiting, unspecified: Secondary | ICD-10-CM

## 2024-09-25 MED ORDER — PROMETHAZINE HCL 25 MG PO TABS: 25.0000 mg | ORAL_TABLET | Freq: Four times a day (QID) | ORAL | 0 refills | Status: AC | PRN

## 2024-09-25 NOTE — ED Provider Notes (Addendum)
 Rachel Harper    CSN: [id number] Arrival date & time: 09/25/24  1632      History   Chief Complaint Chief Complaint  Patient presents with   Nausea    HPI Rachel Harper is a 38 y.o. female.   Patient presents with nausea and vomiting that started about 4 to 5 days ago.  She reports symptoms have improved, and she has been able to tolerate food and fluids over the past 1 to 2 days.  She states that she has cannabis hyperemesis syndrome, and she knows that she is not supposed to be smoking marijuana.  Although, she did smoke marijuana on Sunday resulting in symptoms beginning the next morning.  Denies diarrhea or fever.  Denies any known sick contacts, recent unfavorable foods, travel outside the [place] .  She typically takes Phenergan for nausea given ondansetron  causes QT prolongation.     Past Medical History:  Diagnosis Date   Asthma    Gestational HTN    Herpes genitalia     There are no active problems to display for this patient.   Past Surgical History:  Procedure Laterality Date   APPENDECTOMY     CESAREAN SECTION      OB History     Gravida  1   Para      Term      Preterm      AB      Living         SAB      IAB      Ectopic      Multiple      Live Births               Home Medications    Prior to Admission medications   Medication Sig Start Date End Date Taking? Authorizing Provider  promethazine (PHENERGAN) 25 MG tablet Take 1 tablet (25 mg total) by mouth every 6 (six) hours as needed for nausea or vomiting. 09/25/24  Yes Barkley Kratochvil, Darryle E, FNP  potassium chloride  (K-DUR) 10 MEQ tablet Take 2 tablets (20 mEq total) by mouth daily for 4 days. 04/04/19 04/08/19  Donah Penne LABOR, PA-C    Family History History reviewed. No pertinent family history.  Social History Social History   Tobacco Use   Smoking status: Never   Smokeless tobacco: Never  Vaping Use   Vaping status: Never Used  Substance Use Topics    Alcohol use: No   Drug use: Yes    Types: Marijuana    Comment: was a heavy marijuana smoker, quit 2 weeks ago.     Allergies   Ondansetron  hcl   Review of Systems Review of Systems Per HPI  Physical Exam Triage Vital Signs ED Triage Vitals  Encounter Vitals Group     BP 09/25/24 1643 118/84     Girls Systolic BP Percentile --      Girls Diastolic BP Percentile --      Boys Systolic BP Percentile --      Boys Diastolic BP Percentile --      Pulse Rate 09/25/24 1643 (!) 104     Resp 09/25/24 1643 18     Temp 09/25/24 1643 98.2 F (36.8 C)     Temp Source 09/25/24 1643 Oral     SpO2 09/25/24 1643 97 %     Weight 09/25/24 1642 120 lb (54.4 kg)     Height 09/25/24 1642 5' 2 (1.575 m)     Head Circumference --  Peak Flow --      Pain Score 09/25/24 1642 0     Pain Loc --      Pain Education --      Exclude from Growth Chart --    No data found.  Updated Vital Signs BP 118/84 (BP Location: Right Arm)   Pulse (!) 104   Temp 98.2 F (36.8 C) (Oral)   Resp 18   Ht 5' 2 (1.575 m)   Wt 120 lb (54.4 kg)   LMP 09/20/2024 (Exact Date)   SpO2 97%   Breastfeeding No   BMI 21.95 kg/m   Visual Acuity Right Eye Distance:   Left Eye Distance:   Bilateral Distance:    Right Eye Near:   Left Eye Near:    Bilateral Near:     Physical Exam Constitutional:      General: She is not in acute distress.    Appearance: Normal appearance. She is not toxic-appearing or diaphoretic.  HENT:     Head: Normocephalic and atraumatic.     Mouth/Throat:     Mouth: Mucous membranes are moist.     Pharynx: No posterior oropharyngeal erythema.  Eyes:     Extraocular Movements: Extraocular movements intact.     Conjunctiva/sclera: Conjunctivae normal.  Cardiovascular:     Rate and Rhythm: Normal rate and regular rhythm.     Pulses: Normal pulses.     Heart sounds: Normal heart sounds.  Pulmonary:     Effort: Pulmonary effort is normal. No respiratory distress.      Breath sounds: Normal breath sounds.  Abdominal:     General: Bowel sounds are normal. There is no distension.     Palpations: Abdomen is soft.     Tenderness: There is no abdominal tenderness.  Neurological:     General: No focal deficit present.     Mental Status: She is alert and oriented to person, place, and time. Mental status is at baseline.  Psychiatric:        Mood and Affect: Mood normal.        Behavior: Behavior normal.        Thought Content: Thought content normal.        Judgment: Judgment normal.      UC Treatments / Results  Labs (all labs ordered are listed, but only abnormal results are displayed) Labs Reviewed - No data to display  EKG   Radiology No results found.  Procedures Procedures (including critical Harper time)  Medications Ordered in UC Medications - No data to display  Initial Impression / Assessment and Plan / UC Course  I have reviewed the triage vital signs and the nursing notes.  Pertinent labs & imaging results that were available during my Harper of the patient were reviewed by me and considered in my medical decision making (see chart for details).     Patient presenting with nausea and vomiting due to possible cannabis hyperemesis syndrome.  Other differentials include viral illness versus food related illness.  Again reiterated to patient to avoid smoking marijuana.  There are no signs of acute abdomen or dehydration on exam that would warrant ER evaluation.  Will treat with Phenergan given patient has intolerance to ondansetron  and has taken Phenergan previously and tolerated well.  Reminded patient that Phenergan can cause drowsiness.  She is not currently taking any medications, including potassium, so this should be safe.  Patient also advised to push oral fluids.  Advised patient of strict follow-up and ER  precautions.  Patient verbalized understanding and was agreeable with plan. Final Clinical Impressions(s) / UC Diagnoses   Final  diagnoses:  Cannabinoid hyperemesis syndrome  Nausea and vomiting, unspecified vomiting type   Discharge Instructions   None    ED Prescriptions     Medication Sig Dispense Auth. Provider   promethazine (PHENERGAN) 25 MG tablet Take 1 tablet (25 mg total) by mouth every 6 (six) hours as needed for nausea or vomiting. 30 tablet [facility], Emali Heyward E, [place]      PDMP not reviewed this encounter.   Hazen Darryle BRAVO, [place] 09/25/24 1733    Hazen Darryle BRAVO, [place] 09/25/24 [phone]

## 2024-09-25 NOTE — ED Triage Notes (Signed)
 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  Since then she has been having nausea since then and finally able to keep fluids and food down starting today.
